# Patient Record
Sex: Male | Born: 1995
Health system: Southern US, Community
[De-identification: ages and names within clinical notes are randomized; demographics above are authoritative.]

## PROBLEM LIST (undated history)

## (undated) DIAGNOSIS — Z789 Other specified health status: Secondary | ICD-10-CM

## (undated) HISTORY — DX: Other specified health status: Z78.9

---
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
## 2014-12-14 ENCOUNTER — Emergency Department (HOSPITAL_COMMUNITY)
Admission: EM | Admit: 2014-12-14 | Discharge: 2014-12-15 | Disposition: A | Discharge status: Home / Self Care | Payer: No Typology Code available for payment source | Attending: Emergency Medicine | Admitting: Emergency Medicine

## 2014-12-14 ENCOUNTER — Encounter (HOSPITAL_COMMUNITY): Payer: Self-pay | Admitting: Oncology

## 2014-12-14 DIAGNOSIS — L509 Urticaria, unspecified: Secondary | ICD-10-CM | POA: Diagnosis not present

## 2014-12-14 DIAGNOSIS — R21 Rash and other nonspecific skin eruption: Secondary | ICD-10-CM | POA: Diagnosis present

## 2014-12-14 MED ORDER — DIPHENHYDRAMINE HCL 25 MG PO CAPS: 25.0000 mg | ORAL_CAPSULE | Freq: Four times a day (QID) | ORAL | Status: DC | PRN

## 2014-12-14 MED ORDER — DIPHENHYDRAMINE HCL 25 MG PO CAPS
25.0000 mg | ORAL_CAPSULE | Freq: Once | ORAL | Status: AC
  Administered 2014-12-14: 25 mg via ORAL
  Filled 2014-12-14: qty 1

## 2014-12-14 NOTE — Discharge Instructions (Signed)
You've been treated for hives many times, the cause of hives is difficult o determine as it can be something that you come in contact with something that you've eaten some environmental stimulants t you've breathed in  Please take the Benadryl on a regular basis for the next several days.  Return if you develop abdominal pain, shortness of breath, difficulty swallowing, rapid heart rate, dizziness worsening hives

## 2014-12-14 NOTE — ED Provider Notes (Signed)
CSN: [id number]     Arrival date & time 12/14/14  2159 History  This chart was scribed for non-physician practitioner Earley Favor, NP, working with Mancel Bale, MD, by Tanda Rockers, ED Scribe. This patient was seen in room WTR5/WTR5 and the patient's care was started at 10:44 PM.  Chief Complaint  Patient presents with  . Rash   The history is provided by the patient. No language interpreter was used.     HPI Comments: William Dudley is a 19 y.o. male who presents to the Emergency Department complaining of sudden onset diffuse rash that began approximately 45 minutes ago. Pt has never had symptoms like this in the past. He denies shortness of breath, difficulty swallowing, or any other associated symptoms. Pt also denies new soaps, foods, cosmetics, laundry detergent, or any other new cosmetics products. No new sexual partners recently either.    History reviewed. No pertinent past medical history. History reviewed. No pertinent past surgical history. No family history on file. Social History  Substance Use Topics  . Smoking status: Never Smoker   . Smokeless tobacco: Never Used  . Alcohol Use: Yes     Comment: socially    Review of Systems  HENT: Negative for trouble swallowing.   Respiratory: Negative for shortness of breath.   Gastrointestinal: Negative for abdominal pain.  Skin: Positive for rash.  Neurological: Negative for dizziness.  All other systems reviewed and are negative.  Allergies  Review of patient's allergies indicates no known allergies.  Home Medications   Prior to Admission medications   Medication Sig Start Date End Date Taking? Authorizing Provider  ibuprofen (ADVIL,MOTRIN) 200 MG tablet Take 400 mg by mouth every 6 (six) hours as needed for moderate pain.   Yes Historical Provider, MD  diphenhydrAMINE (BENADRYL) 25 mg capsule Take 1 capsule (25 mg total) by mouth every 6 (six) hours as needed. 12/14/14   Earley Favor, NP   Triage Vitals: BP 153/85 mmHg   Pulse 68  Temp(Src) 97.8 F (36.6 C) (Oral)  Resp 18  SpO2 100%   Physical Exam  Constitutional: He is oriented to person, place, and time. He appears well-developed and well-nourished.  HENT:  Head: Normocephalic.  Mouth/Throat: Oropharynx is clear and moist.  Eyes: Pupils are equal, round, and reactive to light.  Neck: Normal range of motion.  Cardiovascular: Normal rate and regular rhythm.   Pulmonary/Chest: Effort normal and breath sounds normal.  Abdominal: Soft.  Musculoskeletal: Normal range of motion. He exhibits no edema or tenderness.  Neurological: He is alert and oriented to person, place, and time.  Skin: Rash noted. Rash is urticarial.  Nursing note and vitals reviewed.   ED Course  Procedures (including critical care time)  DIAGNOSTIC STUDIES: Oxygen Saturation is 100% on RA, normal by my interpretation.    COORDINATION OF CARE: 10:46 PM-Discussed treatment plan which includes Benadryl with pt at bedside and pt agreed to plan.   Labs Review Labs Reviewed - No data to display  Imaging Review No results found.    EKG Interpretation None    patient is in no respiratory distress is not tachycardic or having abdominal pain.  Recommending of dizziness or nausea.  Living given by mouth Benadryl.  He will be observed.  Denies any new soaps products new close of his history of urticaria. atient reexamined.  Hives are starting to fade.  Patient states the itch has decreased.  He has been given strict return precautions  MDM   Final diagnoses:  Urticaria    I personally performed the services described in this documentation, which was scribed in my presence. The recorded information has been reviewed and is accurate.     Earley Favor, NP 12/14/14 2354  Mancel Bale, MD 12/14/14 [phone]

## 2014-12-14 NOTE — ED Notes (Signed)
Pt presents w/ what appears to be hives on his arms/legs torso.  Per pt this developed in 30-45 minutes.  Denies any changes to diet, soaps or laundry detergent.  Pt endorses itching however did not take any OTC PTA.  No respiratory distress noted.

## 2014-12-14 NOTE — ED Notes (Signed)
Bed: WTR5 Expected date:  Expected time:  Means of arrival:  Comments: 

## 2017-06-15 ENCOUNTER — Encounter: Payer: Self-pay | Admitting: Family Medicine

## 2017-06-15 ENCOUNTER — Ambulatory Visit (INDEPENDENT_AMBULATORY_CARE_PROVIDER_SITE_OTHER): Payer: BLUE CROSS/BLUE SHIELD | Admitting: Family Medicine

## 2017-06-15 VITALS — BP 118/72 | HR 62 | Temp 98.2°F | Ht 77.0 in | Wt 181.1 lb

## 2017-06-15 DIAGNOSIS — L309 Dermatitis, unspecified: Secondary | ICD-10-CM | POA: Diagnosis not present

## 2017-06-15 DIAGNOSIS — L649 Androgenic alopecia, unspecified: Secondary | ICD-10-CM | POA: Insufficient documentation

## 2017-06-15 MED ORDER — TRIAMCINOLONE ACETONIDE 0.1 % EX CREA: TOPICAL_CREAM | CUTANEOUS | 0 refills | Status: DC

## 2017-06-15 NOTE — Patient Instructions (Signed)
Keep skin well hydrated with non-scented lotions.  Take a picture next time it flares up or come in.   Let us know if you need anything.

## 2017-06-15 NOTE — Progress Notes (Signed)
Chief Complaint  Patient presents with  . Establish Care       New Patient Visit SUBJECTIVE: HPI: William Dudley is an 22 y.o.male who is being seen for establishing care.  The patient was previously seen at pediatrician's office.  For the past year, he has been having burning and itching over his legs and sometimes arms. It is random.  It will peel and turn light after going away.  No new lotions, soaps, topicals or detergents.  The areas do not hurt or drain.  He does not currently have any areas of itching.  He has not tried anything for this at home.  No sick contacts.  He is also having male pattern baldness.  He is wondering if this is normal.  He does not wish to take any medicine.  He is unsure if any of his mother's male family members have this issue.  His father started having this issue in his 30s.  No Known Allergies  Past Medical History:  Diagnosis Date  . No known health problems    Past Surgical History:  Procedure Laterality Date  . OTHER SURGICAL HISTORY  2000   Some neurosurg procedure; infant   Social History   Socioeconomic History  . Marital status: Single  Tobacco Use  . Smoking status: Never Smoker  . Smokeless tobacco: Never Used  Substance and Sexual Activity  . Alcohol use: Yes    Comment: socially  . Drug use: Yes    Types: Marijuana  . Sexual activity: No   Family History  Problem Relation Age of Onset  . Cancer Neg Hx    Takes no meds routinely.  ROS Const: Denies fevers  Skin: Denies current itching   OBJECTIVE: BP 118/72 (BP Location: Left Arm, Patient Position: Sitting, Cuff Size: Normal)   Pulse 62   Temp 98.2 F (36.8 C) (Oral)   Ht 6\' 5"  (1.956 m)   Wt 181 lb 2 oz (82.2 kg)   SpO2 97%   BMI 21.48 kg/m   Constitutional: -  VS reviewed -  Well developed, well nourished, appears stated age -  No apparent distress  Psychiatric: -  Oriented to person, place, and time -  Memory intact -  Affect and mood normal -  Fluent  conversation, good eye contact -  Judgment and insight age appropriate  Eye: -  Conjunctivae clear, no discharge -  Pupils symmetric, round, reactive to light  ENMT: -  MMM    Pharynx moist, no exudate, no erythema  Neck: -  No gross swelling, no palpable masses -  Thyroid midline, not enlarged, mobile, no palpable masses  Cardiovascular: -  RRR -  No LE edema  Respiratory: -  Normal respiratory effort, no accessory muscle use, no retraction -  Breath sounds equal, no wheezes, no ronchi, no crackles  Musculoskeletal: -  No clubbing, no cyanosis -  Gait normal  Skin: -  See below -  Male pattern baldness noted -  Warm and dry to palpation  Media Information     ASSESSMENT/PLAN: Dermatitis - Plan: triamcinolone cream (KENALOG) 0.1 %  Male pattern baldness  Patient instructed to sign release of records form from his previous PCP. Steroid cream given should dermatitis arise again.  Looks like postinflammatory hypopigmentation today, however that is not his main issue if he is having itching.  He was instructed to take a picture or schedule appointment should this return. Reassurance for male pattern baldness, offered pharmacotherapy but he declined. Patient  should return for CPE. The patient voiced understanding and agreement to the plan.   Jilda Roche [place], DO 06/15/17  12:30 PM

## 2017-06-15 NOTE — Progress Notes (Signed)
Pre visit review using our clinic review tool, if applicable. No additional management support is needed unless otherwise documented below in the visit note. 

## 2017-06-22 ENCOUNTER — Encounter: Payer: Self-pay | Admitting: Family Medicine

## 2017-06-22 ENCOUNTER — Ambulatory Visit (INDEPENDENT_AMBULATORY_CARE_PROVIDER_SITE_OTHER): Payer: BLUE CROSS/BLUE SHIELD | Admitting: Family Medicine

## 2017-06-22 VITALS — BP 124/78 | HR 69 | Temp 98.1°F | Ht 77.0 in | Wt 183.5 lb

## 2017-06-22 DIAGNOSIS — Z23 Encounter for immunization: Secondary | ICD-10-CM | POA: Diagnosis not present

## 2017-06-22 DIAGNOSIS — Z114 Encounter for screening for human immunodeficiency virus [HIV]: Secondary | ICD-10-CM

## 2017-06-22 DIAGNOSIS — Z0000 Encounter for general adult medical examination without abnormal findings: Secondary | ICD-10-CM | POA: Diagnosis not present

## 2017-06-22 NOTE — Progress Notes (Signed)
Chief Complaint  Patient presents with  . Annual Exam    Well Male William Dudley is here for a complete physical.   His last physical was >1 year ago.  Current diet: in general, a "healthy" diet   Current exercise: Lift weights, play basketball  Weight trend: stable Does pt snore? No. Daytime fatigue? No. Seat belt? Yes.    Health maintenance Tetanus- No HIV- No  Past Medical History:  Diagnosis Date  . No known health problems     Past Surgical History:  Procedure Laterality Date  . OTHER SURGICAL HISTORY  2000   Some neurosurg procedure; infant   Medications  Is not currently taking any meds routinely.  Allergies No Known Allergies   Family History Family History  Problem Relation Age of Onset  . Cancer Neg Hx     Review of Systems: Constitutional: no fevers or chills Eye:  no recent significant change in vision Ear/Nose/Mouth/Throat:  Ears:  no tinnitus or hearing loss Nose/Mouth/Throat:  no complaints of nasal congestion or bleeding, no sore throat Cardiovascular:  no chest pain, no palpitations Respiratory:  no cough and no shortness of breath Gastrointestinal:  no abdominal pain, no change in bowel habits, no nausea, vomiting, diarrhea, or constipation and no black or bloody stool GU:  Male: negative for dysuria, frequency, and incontinence and negative for prostate symptoms Musculoskeletal/Extremities:  no pain, redness, or swelling of the joints Integumentary (Skin/Breast):  no abnormal skin lesions reported Neurologic:  no headaches, no numbness, tingling Endocrine: No unexpected weight changes Hematologic/Lymphatic:  no night sweats  Exam BP 124/78 (BP Location: Left Arm, Patient Position: Sitting, Cuff Size: Normal)   Pulse 69   Temp 98.1 F (36.7 C) (Oral)   Ht 6\' 5"  (1.956 m)   Wt 183 lb 8 oz (83.2 kg)   SpO2 97%   BMI 21.76 kg/m  General:  well developed, well nourished, in no apparent distress Skin:  no significant moles, warts, or  growths Head:  no masses, lesions, or tenderness Eyes:  pupils equal and round, sclera anicteric without injection Ears:  canals 100% obstructed b/l w cerumen Nose:  nares patent, septum midline, mucosa normal Throat/Pharynx:  lips and gingiva without lesion; tongue and uvula midline; non-inflamed pharynx; no exudates or postnasal drainage Neck: neck supple without adenopathy, thyromegaly, or masses Lungs:  clear to auscultation, breath sounds equal bilaterally, no respiratory distress Cardio:  regular rate and rhythm without murmurs, heart sounds without clicks or rubs Abdomen:  abdomen soft, nontender; bowel sounds normal; no masses or organomegaly Genital (male): circumcised penis, no lesions or discharge; testes present bilaterally without masses or tenderness Rectal: Deferred Musculoskeletal:  symmetrical muscle groups noted without atrophy or deformity Extremities:  no clubbing, cyanosis, or edema, no deformities, no skin discoloration Neuro:  gait normal; deep tendon reflexes normal and symmetric Psych: well oriented with normal range of affect and appropriate judgment/insight  Assessment and Plan  Well adult exam - Plan: Lipid panel, Comprehensive metabolic panel  Screening for HIV (human immunodeficiency virus) - Plan: HIV antibody  Need for tetanus booster - Plan: Tdap vaccine greater than or equal to 7yo IM   Well 22 y.o. male. Counseled on diet and exercise. He is doing well. Other orders as above. Follow up in 1 year pending the above workup.  The patient voiced understanding and agreement to the plan.  Jilda Rocheicholas Paul [place], DO 06/22/17 2:42 PM

## 2017-06-22 NOTE — Progress Notes (Signed)
Pre visit review using our clinic review tool, if applicable. No additional management support is needed unless otherwise documented below in the visit note. 

## 2017-06-22 NOTE — Patient Instructions (Signed)
Keep up the good work.   Give us 2-3 business days to get the results of your labs back. If labs are normal, you will likely receive a letter in the mail unless you have MyChart. This can take longer than 2-3 business days.   Let us know if you need anything.  

## 2017-06-23 LAB — LIPID PANEL
Cholesterol: 151 mg/dL (ref 0–200)
HDL: 89.7 mg/dL (ref >39.00)
LDL Cholesterol: 54 mg/dL (ref 0–99)
NonHDL: 60.9
Total CHOL/HDL Ratio: 2
Triglycerides: 35 mg/dL (ref 0.0–149.0)
VLDL: 7 mg/dL (ref 0.0–40.0)

## 2017-06-23 LAB — COMPREHENSIVE METABOLIC PANEL
ALT: 13 U/L (ref 0–53)
AST: 20 U/L (ref 0–37)
Albumin: 4.8 g/dL (ref 3.5–5.2)
Alkaline Phosphatase: 52 U/L (ref 39–117)
BUN: 9 mg/dL (ref 6–23)
CHLORIDE: 100 meq/L (ref 96–112)
CO2: 34 mEq/L — ABNORMAL HIGH (ref 19–32)
Calcium: 10 mg/dL (ref 8.4–10.5)
Creatinine, Ser: 0.91 mg/dL (ref 0.40–1.50)
GFR: 132.35 mL/min (ref >60.00)
Glucose, Bld: 70 mg/dL (ref 70–99)
POTASSIUM: 3.8 meq/L (ref 3.5–5.1)
SODIUM: 139 meq/L (ref 135–145)
Total Bilirubin: 1.1 mg/dL (ref 0.2–1.2)
Total Protein: 8 g/dL (ref 6.0–8.3)

## 2017-06-23 LAB — HIV ANTIBODY (ROUTINE TESTING W REFLEX): HIV 1&2 Ab, 4th Generation: NONREACTIVE

## 2017-06-24 ENCOUNTER — Encounter: Payer: Self-pay | Admitting: Family Medicine

## 2018-04-09 ENCOUNTER — Emergency Department (HOSPITAL_COMMUNITY): Payer: BLUE CROSS/BLUE SHIELD

## 2018-04-09 ENCOUNTER — Other Ambulatory Visit: Payer: Self-pay

## 2018-04-09 ENCOUNTER — Encounter (HOSPITAL_COMMUNITY): Payer: Self-pay | Admitting: Emergency Medicine

## 2018-04-09 DIAGNOSIS — S6981XA Other specified injuries of right wrist, hand and finger(s), initial encounter: Secondary | ICD-10-CM | POA: Diagnosis present

## 2018-04-09 DIAGNOSIS — Y999 Unspecified external cause status: Secondary | ICD-10-CM | POA: Diagnosis not present

## 2018-04-09 DIAGNOSIS — Y939 Activity, unspecified: Secondary | ICD-10-CM | POA: Diagnosis not present

## 2018-04-09 DIAGNOSIS — S62314A Displaced fracture of base of fourth metacarpal bone, right hand, initial encounter for closed fracture: Secondary | ICD-10-CM | POA: Insufficient documentation

## 2018-04-09 DIAGNOSIS — M79641 Pain in right hand: Secondary | ICD-10-CM | POA: Diagnosis not present

## 2018-04-09 DIAGNOSIS — S62316A Displaced fracture of base of fifth metacarpal bone, right hand, initial encounter for closed fracture: Secondary | ICD-10-CM | POA: Diagnosis not present

## 2018-04-09 DIAGNOSIS — W19XXXA Unspecified fall, initial encounter: Secondary | ICD-10-CM | POA: Insufficient documentation

## 2018-04-09 DIAGNOSIS — Y929 Unspecified place or not applicable: Secondary | ICD-10-CM | POA: Insufficient documentation

## 2018-04-09 NOTE — ED Triage Notes (Signed)
Patient c/o left hand pain after fall tonight. Denies head injury, neck and back pain. Ambulatory.

## 2018-04-10 ENCOUNTER — Emergency Department (HOSPITAL_COMMUNITY)
Admission: EM | Admit: 2018-04-10 | Discharge: 2018-04-10 | Disposition: A | Discharge status: Home / Self Care | Payer: BLUE CROSS/BLUE SHIELD | Attending: Emergency Medicine | Admitting: Emergency Medicine

## 2018-04-10 DIAGNOSIS — S62314A Displaced fracture of base of fourth metacarpal bone, right hand, initial encounter for closed fracture: Secondary | ICD-10-CM | POA: Diagnosis not present

## 2018-04-10 DIAGNOSIS — S62316A Displaced fracture of base of fifth metacarpal bone, right hand, initial encounter for closed fracture: Secondary | ICD-10-CM | POA: Diagnosis not present

## 2018-04-10 MED ORDER — HYDROCODONE-ACETAMINOPHEN 5-325 MG PO TABS: 1.0000 | ORAL_TABLET | Freq: Four times a day (QID) | ORAL | 0 refills | Status: DC | PRN

## 2018-04-10 MED ORDER — HYDROCODONE-ACETAMINOPHEN 5-325 MG PO TABS
2.0000 | ORAL_TABLET | Freq: Once | ORAL | Status: AC
  Administered 2018-04-10: 2 via ORAL
  Filled 2018-04-10: qty 2

## 2018-04-10 NOTE — ED Notes (Signed)
Splint applied by ortho tech, visualized by doctor.  Pt understanding symptoms requiring f/u and plans to f/u with ortho as directed.

## 2018-04-10 NOTE — ED Notes (Signed)
Bed: WA08 Expected date: 04/10/18 Expected time: 7:00 AM Means of arrival:  Comments: For level 4/5 only

## 2018-04-10 NOTE — ED Provider Notes (Signed)
[facility]-EMERGENCY DEPT Provider Note   CSN: [id number] Arrival date & time: 04/09/18  2151     History   Chief Complaint Chief Complaint  Patient presents with  . Hand Pain    HPI William Dudley is a 22 y.o. male.  The history is provided by the patient.  Hand Pain  This is a new problem. The current episode started 3 to 5 hours ago. The problem occurs constantly. The problem has been gradually worsening. The symptoms are aggravated by bending. The symptoms are relieved by rest.   He reports he fell on his right hand several hours ago.  He now has pain and swelling to the right hand.  No other acute complaints Past Medical History:  Diagnosis Date  . No known health problems     Patient Active Problem List   Diagnosis Date Noted  . Well adult exam 06/22/2017  . Male pattern baldness 06/15/2017    Past Surgical History:  Procedure Laterality Date  . OTHER SURGICAL HISTORY  2000   Some neurosurg procedure; infant        Home Medications    Prior to Admission medications   Medication Sig Start Date End Date Taking? Authorizing Provider  HYDROcodone-acetaminophen (NORCO/VICODIN) 5-325 MG tablet Take 1 tablet by mouth every 6 (six) hours as needed for severe pain. 04/10/18   Zadie RhineWickline, Arriona Prest, MD  triamcinolone cream (KENALOG) 0.1 % If lesion arise, use twice daily for 10 days. Patient not taking: Reported on 06/22/2017 06/15/17   Sharlene DoryWendling, Nicholas Paul, DO    Family History Family History  Problem Relation Age of Onset  . Cancer Neg Hx     Social History Social History   Tobacco Use  . Smoking status: Never Smoker  . Smokeless tobacco: Never Used  Substance Use Topics  . Alcohol use: Yes    Comment: socially  . Drug use: Yes    Types: Marijuana     Allergies   Patient has no known allergies.   Review of Systems Review of Systems  Musculoskeletal: Positive for arthralgias and joint swelling.  Skin: Negative for wound.      Physical Exam Updated Vital Signs BP (!) 141/94   Pulse 60   Resp 17   Ht 1.956 m (6\' 5" )   Wt 83.9 kg   SpO2 100%   BMI 21.94 kg/m   Physical Exam  CONSTITUTIONAL: Well developed/well nourished HEAD: Normocephalic/atraumatic ENMT: Mucous membranes moist NECK: supple no meningeal signs CV: S1/S2 noted, no murmurs/rubs/gallops noted LUNGS: Lungs are clear to auscultation bilaterally, no apparent distress ABDOMEN: soft NEURO: Pt is awake/alert/appropriate, moves all extremitiesx4.  No facial droop.   EXTREMITIES: pulses normal/equal, full ROM, tenderness and swelling to lateral aspect of right hand.  No abrasions or lacerations.  No fight bites noted.  Mild tenderness to right wrist.  No tenderness to right elbow right shoulder.  He is able to move all of his fingers on right hand.  Significant soft tissue swelling to the right hand SKIN: warm, color normal PSYCH: no abnormalities of mood noted, alert and oriented to situation  ED Treatments / Results  Labs (all labs ordered are listed, but only abnormal results are displayed) Labs Reviewed - No data to display  EKG None  Radiology Dg Hand Complete Right  Result Date: 04/09/2018 CLINICAL DATA:  Right hand pain following a fall tonight. EXAM: RIGHT HAND - COMPLETE 3+ VIEW COMPARISON:  None. FINDINGS: Three views of the right hand demonstrate comminuted  fractures of the bases of the 4th and 5th metacarpals with impaction and ventral angulation of the distal fragments. There is intra-articular involvement involving both fractures. IMPRESSION: Comminuted fractures of the bases of the 4th and 5th metacarpals, as described above. Electronically Signed   By: Beckie Salts M.D.   On: 04/09/2018 22:32    Procedures Procedures   Medications Ordered in ED Medications  HYDROcodone-acetaminophen (NORCO/VICODIN) 5-325 MG per tablet 2 tablet (2 tablets Oral Given 04/10/18 0217)   SPLINT APPLICATION Date/Time: 2:30 AM Authorized  by: Joya Gaskins Consent: Verbal consent obtained. Risks and benefits: risks, benefits and alternatives were discussed Consent given by: patient Splint applied by: orthopedic technician Location details: right hand Splint type: ulnar gutter Supplies used: ortho glass Post-procedure: The splinted body part was neurovascularly unchanged following the procedure. Patient tolerance: Patient tolerated the procedure well with no immediate complications.     Initial Impression / Assessment and Plan / ED Course  I have reviewed the triage vital signs and the nursing notes.  Pertinent  imaging results that were available during my care of the patient were reviewed by me and considered in my medical decision making (see chart for details).     Patient presents with fourth and fifth metacarpal fractures.  Ulnar gutter splint is been applied.  He is referred to hand for close follow-up  Final Clinical Impressions(s) / ED Diagnoses   Final diagnoses:  Closed displaced fracture of base of fourth metacarpal bone of right hand, initial encounter  Closed displaced fracture of base of fifth metacarpal bone of right hand, initial encounter    ED Discharge Orders         Ordered    HYDROcodone-acetaminophen (NORCO/VICODIN) 5-325 MG tablet  Every 6 hours PRN     04/10/18 0228           Zadie Rhine, MD 04/10/18 0250

## 2018-04-12 DIAGNOSIS — S62314A Displaced fracture of base of fourth metacarpal bone, right hand, initial encounter for closed fracture: Secondary | ICD-10-CM | POA: Diagnosis not present

## 2018-04-12 DIAGNOSIS — S63054A Dislocation of other carpometacarpal joint of right hand, initial encounter: Secondary | ICD-10-CM | POA: Diagnosis not present

## 2018-04-12 DIAGNOSIS — S62316A Displaced fracture of base of fifth metacarpal bone, right hand, initial encounter for closed fracture: Secondary | ICD-10-CM | POA: Diagnosis not present

## 2018-04-25 DIAGNOSIS — S62314D Displaced fracture of base of fourth metacarpal bone, right hand, subsequent encounter for fracture with routine healing: Secondary | ICD-10-CM | POA: Diagnosis not present

## 2018-04-25 DIAGNOSIS — M25641 Stiffness of right hand, not elsewhere classified: Secondary | ICD-10-CM | POA: Diagnosis not present

## 2018-05-09 DIAGNOSIS — S62316D Displaced fracture of base of fifth metacarpal bone, right hand, subsequent encounter for fracture with routine healing: Secondary | ICD-10-CM | POA: Diagnosis not present

## 2018-05-10 DIAGNOSIS — M25641 Stiffness of right hand, not elsewhere classified: Secondary | ICD-10-CM | POA: Diagnosis not present

## 2018-05-12 DIAGNOSIS — M25641 Stiffness of right hand, not elsewhere classified: Secondary | ICD-10-CM | POA: Diagnosis not present

## 2018-05-16 DIAGNOSIS — S62316D Displaced fracture of base of fifth metacarpal bone, right hand, subsequent encounter for fracture with routine healing: Secondary | ICD-10-CM | POA: Diagnosis not present

## 2018-05-17 DIAGNOSIS — M25641 Stiffness of right hand, not elsewhere classified: Secondary | ICD-10-CM | POA: Diagnosis not present

## 2018-05-19 DIAGNOSIS — M25641 Stiffness of right hand, not elsewhere classified: Secondary | ICD-10-CM | POA: Diagnosis not present

## 2018-05-24 DIAGNOSIS — M25641 Stiffness of right hand, not elsewhere classified: Secondary | ICD-10-CM | POA: Diagnosis not present

## 2018-05-26 DIAGNOSIS — M25641 Stiffness of right hand, not elsewhere classified: Secondary | ICD-10-CM | POA: Diagnosis not present

## 2018-05-30 DIAGNOSIS — M25641 Stiffness of right hand, not elsewhere classified: Secondary | ICD-10-CM | POA: Diagnosis not present

## 2018-06-06 DIAGNOSIS — S62316D Displaced fracture of base of fifth metacarpal bone, right hand, subsequent encounter for fracture with routine healing: Secondary | ICD-10-CM | POA: Diagnosis not present

## 2018-06-07 DIAGNOSIS — M25641 Stiffness of right hand, not elsewhere classified: Secondary | ICD-10-CM | POA: Diagnosis not present

## 2018-06-12 DIAGNOSIS — M25641 Stiffness of right hand, not elsewhere classified: Secondary | ICD-10-CM | POA: Diagnosis not present

## 2018-06-26 DIAGNOSIS — M25641 Stiffness of right hand, not elsewhere classified: Secondary | ICD-10-CM | POA: Diagnosis not present

## 2018-07-11 DIAGNOSIS — S62316D Displaced fracture of base of fifth metacarpal bone, right hand, subsequent encounter for fracture with routine healing: Secondary | ICD-10-CM | POA: Diagnosis not present

## 2018-10-02 DIAGNOSIS — H66002 Acute suppurative otitis media without spontaneous rupture of ear drum, left ear: Secondary | ICD-10-CM | POA: Diagnosis not present

## 2018-11-05 DIAGNOSIS — M79643 Pain in unspecified hand: Secondary | ICD-10-CM | POA: Diagnosis not present

## 2019-01-09 ENCOUNTER — Other Ambulatory Visit: Payer: Self-pay

## 2019-01-09 ENCOUNTER — Ambulatory Visit
Admission: EM | Admit: 2019-01-09 | Discharge: 2019-01-09 | Disposition: A | Discharge status: Home / Self Care | Payer: BC Managed Care – PPO

## 2019-01-09 DIAGNOSIS — M7989 Other specified soft tissue disorders: Secondary | ICD-10-CM | POA: Diagnosis not present

## 2019-01-09 NOTE — ED Provider Notes (Addendum)
[facility]    CSN: [id number] Arrival date & time: 01/09/19  1105      History   Chief Complaint Chief Complaint  Patient presents with  . Hand Injury    HPI Zain Lankford is a 23 y.o. male s/p surgical pair of closed displaced fractures of base of fourth and fifth metacarpals (Dec 2019) presents for swelling near the scar on the ventral aspect of his right hand since this morning.  Patient states that he massages his scar every day, sometimes it will swell and then go back down.  States it was more swollen than normal this morning when he woke up.  Denies pain, redness, warmth, injury to the area.  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  Has not tried anything for the swelling.   Past Medical History:  Diagnosis Date  . No known health problems     Patient Active Problem List   Diagnosis Date Noted  . Well adult exam 06/22/2017  . Male pattern baldness 06/15/2017    Past Surgical History:  Procedure Laterality Date  . OTHER SURGICAL HISTORY  2000   Some neurosurg procedure; infant       Home Medications    Prior to Admission medications   Not on File    Family History Family History  Problem Relation Age of Onset  . Healthy Mother   . Healthy Father   . Cancer Neg Hx     Social History Social History   Tobacco Use  . Smoking status: Never Smoker  . Smokeless tobacco: Never Used  Substance Use Topics  . Alcohol use: Yes    Comment: socially  . Drug use: Not Currently    Types: Marijuana     Allergies   Patient has no known allergies.   Review of Systems Review of Systems  Constitutional: Negative for fatigue and fever.  Respiratory: Negative for cough and shortness of breath.   Cardiovascular: Negative for chest pain and palpitations.  Gastrointestinal: Negative for abdominal pain, diarrhea and vomiting.  Musculoskeletal: Negative for back  pain and neck pain.  Skin: Negative for rash and wound.  Neurological: Negative for speech difficulty, weakness, numbness and headaches.  All other systems reviewed and are negative.    Physical Exam Triage Vital Signs ED Triage Vitals  Enc Vitals Group     BP 01/09/19 1118 (!) 133/94     Pulse Rate 01/09/19 1118 65     Resp 01/09/19 1118 16     Temp 01/09/19 1118 98.2 F (36.8 C)     Temp Source 01/09/19 1118 Oral     SpO2 01/09/19 1118 99 %     Weight --      Height --      Head Circumference --      Peak Flow --      Pain Score 01/09/19 1119 3     Pain Loc --      Pain Edu? --      Excl. in [place]? --    No data found.  Updated Vital Signs BP (!) 133/94 (BP Location: Left Arm)   Pulse 65   Temp 98.2 F (36.8 C) (Oral)   Resp 16   SpO2 99%   Visual Acuity Right Eye Distance:   Left Eye Distance:   Bilateral Distance:    Right Eye Near:   Left Eye Near:    Bilateral  Near:     Physical Exam Constitutional:      General: He is not in acute distress. HENT:     Head: Normocephalic and atraumatic.  Eyes:     General: No scleral icterus.    Pupils: Pupils are equal, round, and reactive to light.  Cardiovascular:     Rate and Rhythm: Normal rate.  Pulmonary:     Effort: Pulmonary effort is normal. No respiratory distress.     Breath sounds: No wheezing.  Musculoskeletal: Normal range of motion.        General: No tenderness.     Comments: NVI  Skin:    General: Skin is warm.     Coloration: Skin is not jaundiced.     Findings: No bruising.     Comments: 7 cm incisional scar that is well-healed with proximal keloid that has adventitious scar tissue beneath it.  No fluctuance, warmth, redness, TTP.  Neurological:     General: No focal deficit present.     Mental Status: He is alert.     Sensory: No sensory deficit.     Deep Tendon Reflexes: Reflexes normal.      UC Treatments / Results  Labs (all labs ordered are listed, but only abnormal results are  displayed) Labs Reviewed - No data to display  EKG   Radiology No results found.  Procedures Procedures (including critical care time)  Medications Ordered in UC Medications - No data to display  Initial Impression / Assessment and Plan / UC Course  I have reviewed the triage vital signs and the nursing notes.  Pertinent labs & imaging results that were available during my care of the patient were reviewed by me and considered in my medical decision making (see chart for details).     1.  Swelling of right hand Likely aggravated by massage/gua sha therapy.  Due to short duration, lack of pain/inciting event radiography was deferred at this time.  Patient to treat supportively as listed below and monitor for growth/pain; reevaluate in 1 week if needed.  Return precautions discussed, patient verbalized understanding and is agreeable to plan. Final Clinical Impressions(s) / UC Diagnoses   Final diagnoses:  Swelling of right hand     Discharge Instructions     Take 800 mg ibuprofen 3 times a day with meals for 1 week. Follow-up with primary care urgent care for reevaluation at that time. Return sooner if you develop worsening pain, swelling, redness, warmth.    ED Prescriptions    None     Controlled Substance Prescriptions [place] Controlled Substance Registry consulted? Not Applicable   Shea EvansHall-Potvin, Brittany, PA-C 01/09/19 1151    Hall-Potvin, [place], [place] JerseyPA-C 01/09/19 1152

## 2019-01-09 NOTE — ED Triage Notes (Signed)
Pt presents to UC w/ c/o left hand swelling at site of surgery 9 months ago. Pt states swelling from surgery had lessened and swelling increased last night. Pt has not taken any OTC medications for pain since surgery. Full ROM of wrist and fingers.

## 2019-01-09 NOTE — Discharge Instructions (Signed)
Take 800 mg ibuprofen 3 times a day with meals for 1 week. Follow-up with primary care urgent care for reevaluation at that time. Return sooner if you develop worsening pain, swelling, redness, warmth.

## 2020-08-10 IMAGING — CR DG HAND COMPLETE 3+V*R*
3 series · 3 of 3 positions shown · non-contrast
Comparison: None.

CLINICAL DATA: Right hand pain following a fall tonight.

EXAM:
RIGHT HAND - COMPLETE 3+ VIEW

[x hand pa right]
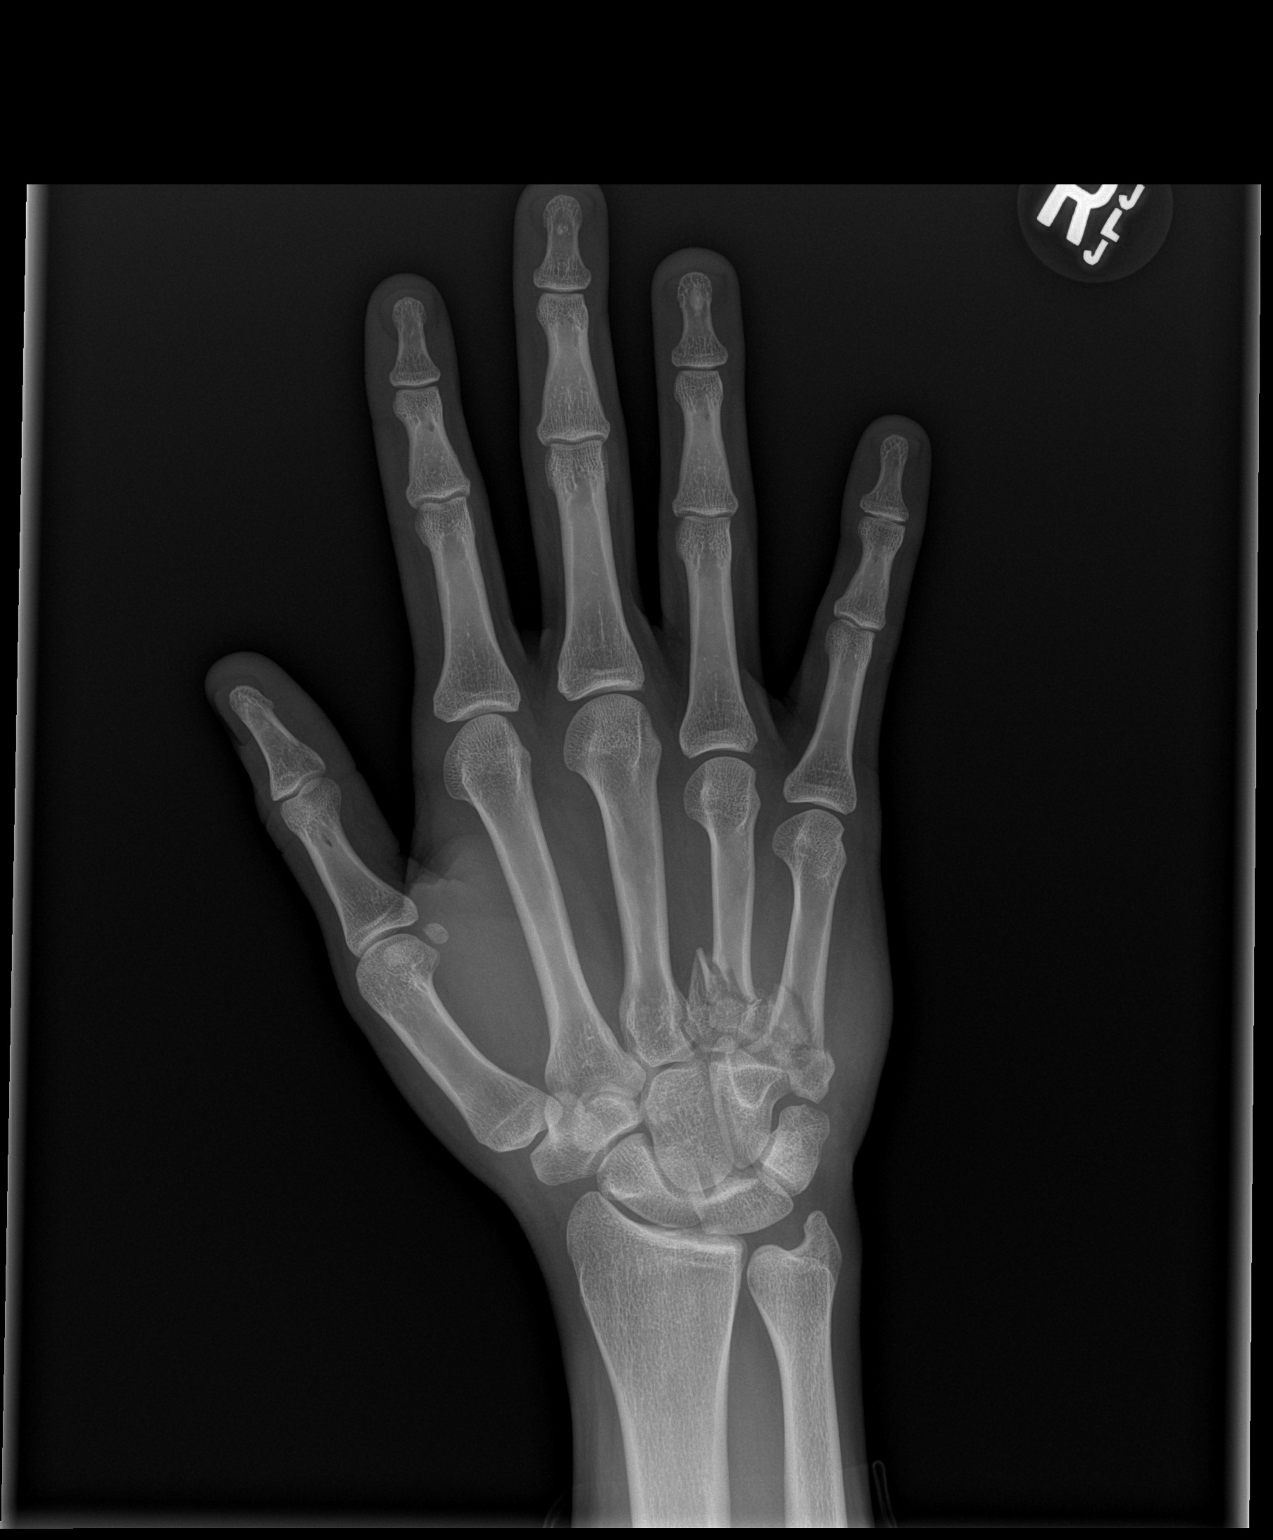

[x hand obl right]
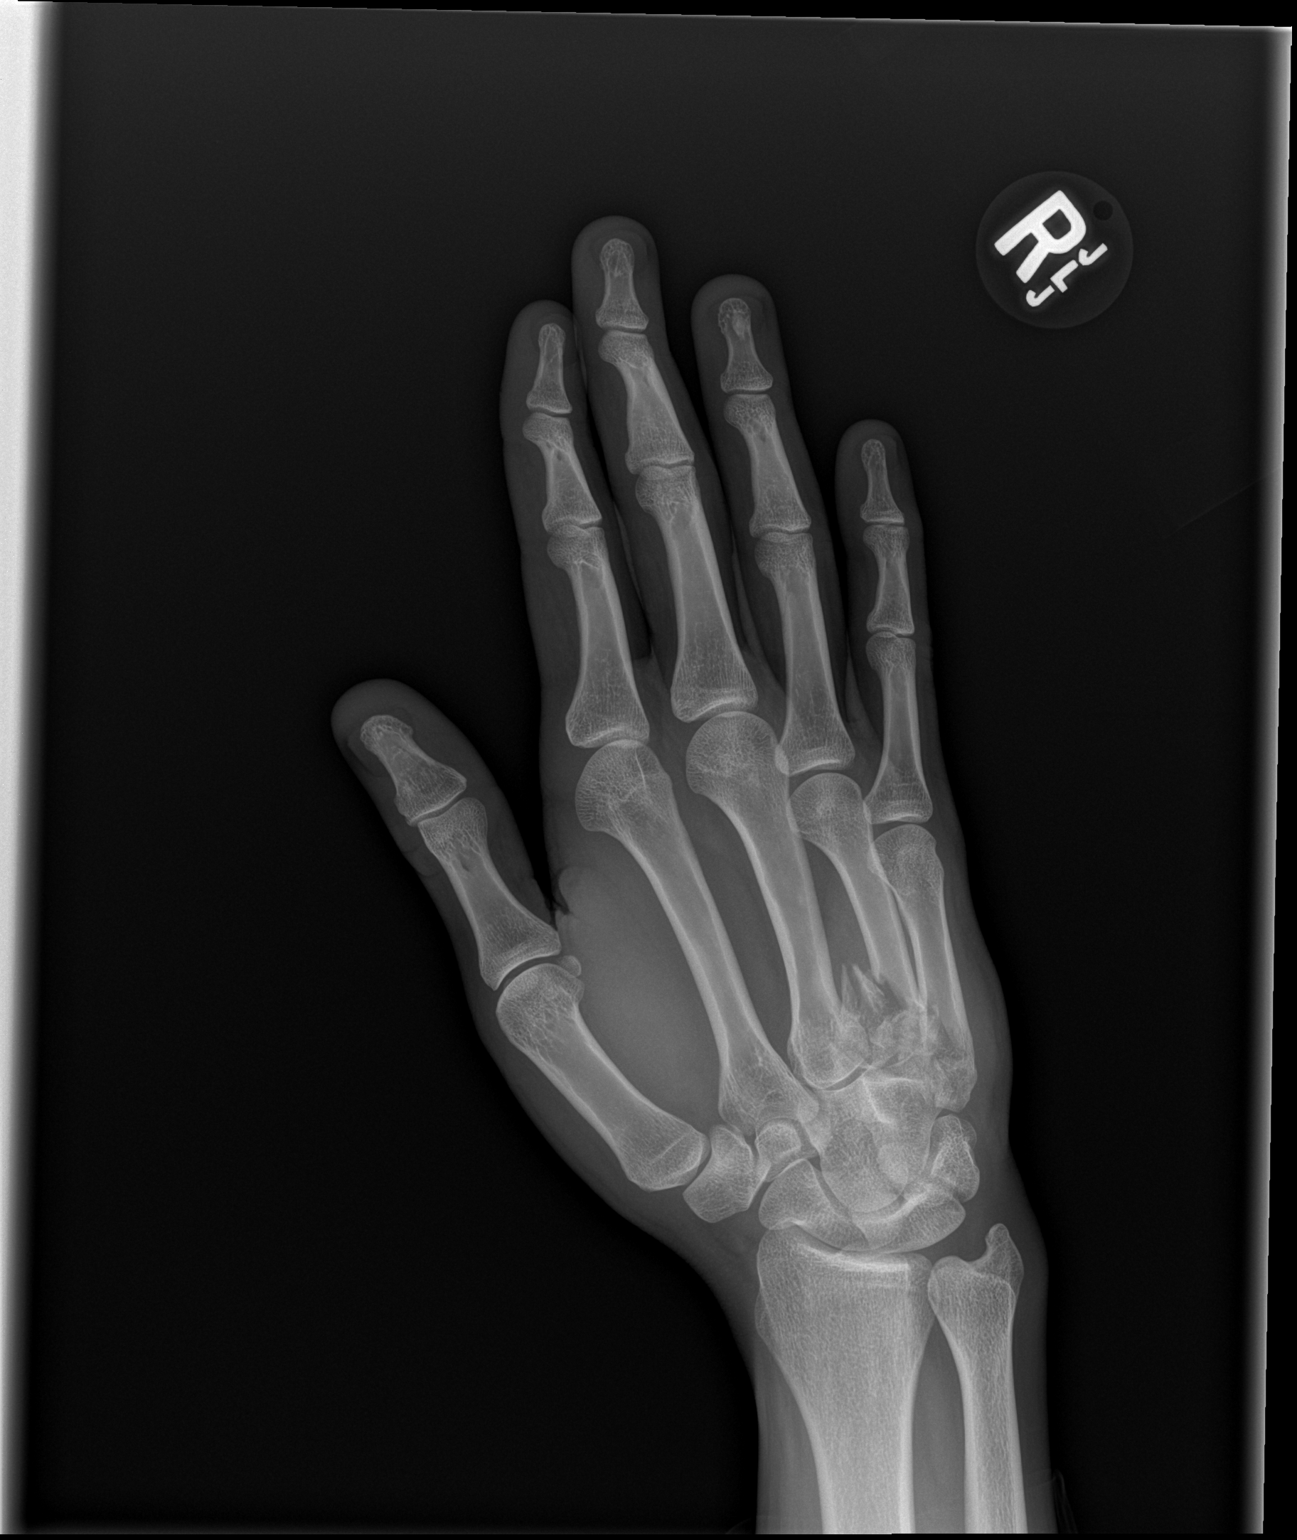

[x hand lat right]
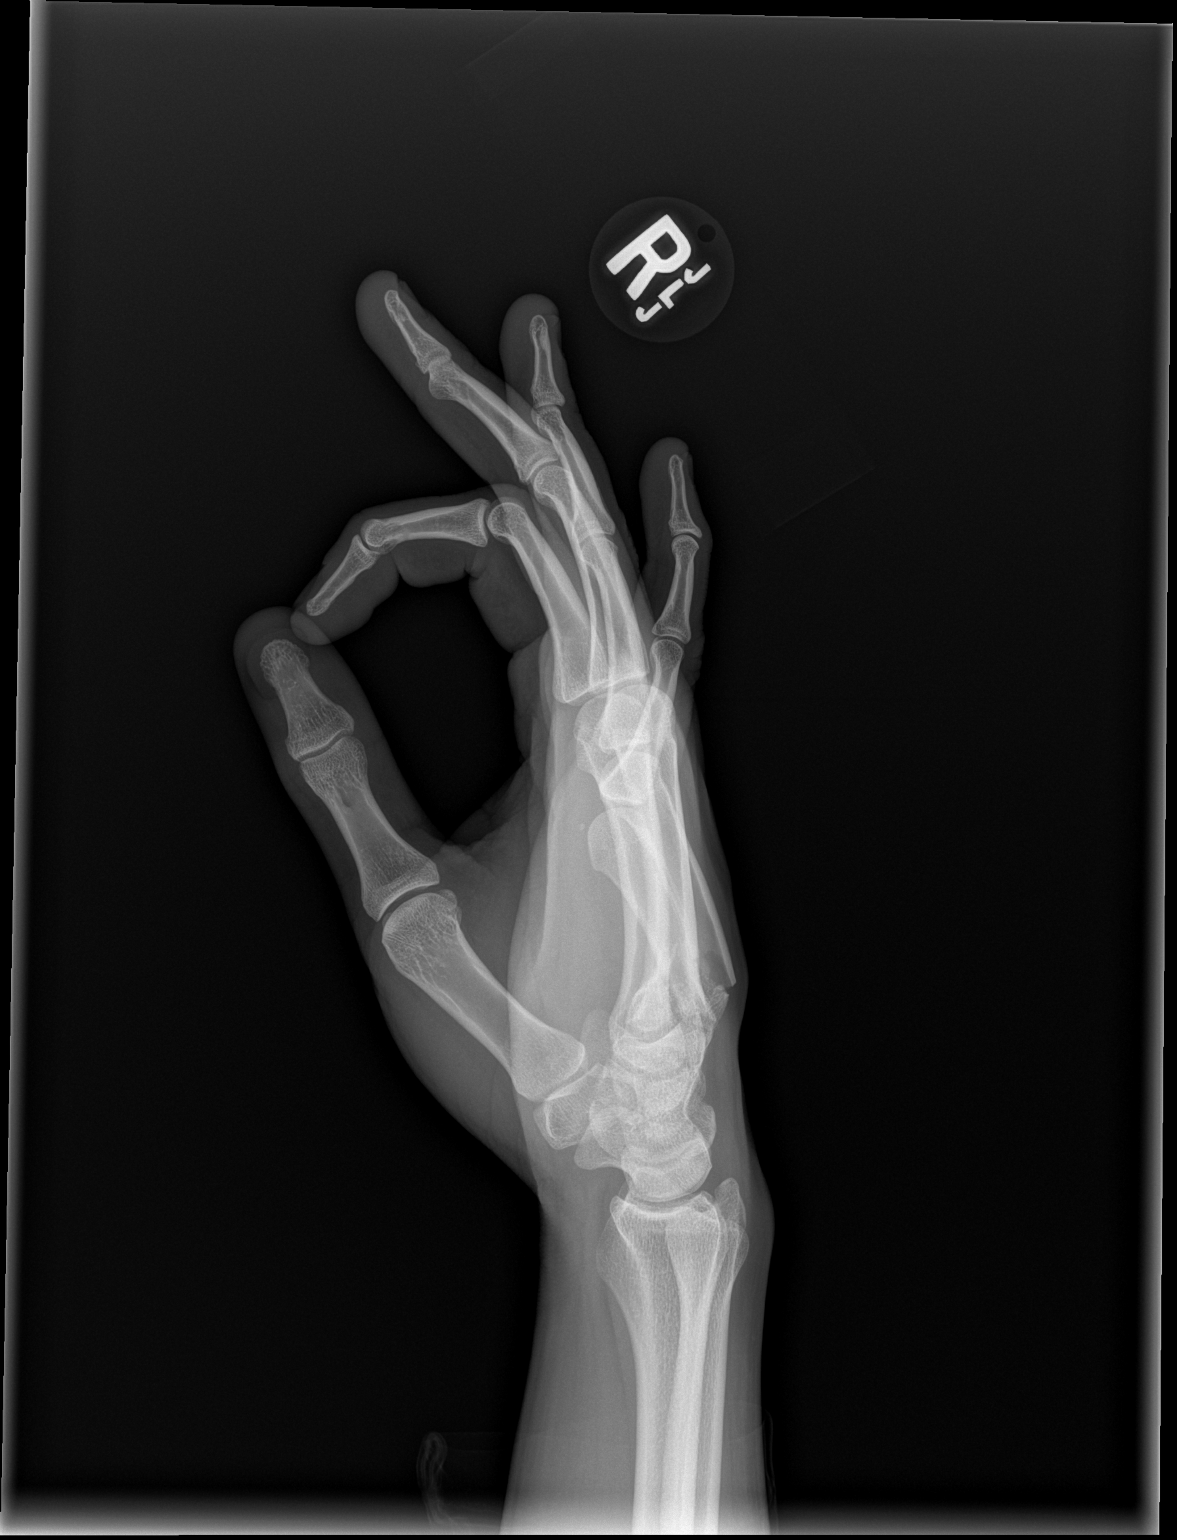

[3 of 3 positions shown; findings below may reference images not displayed]

FINDINGS: Three views of the right hand demonstrate comminuted fractures of
the bases of the 4th and 5th metacarpals with impaction and ventral
angulation of the distal fragments. There is intra-articular
involvement involving both fractures.
IMPRESSION: Comminuted fractures of the bases of the 4th and 5th metacarpals, as
described above.
